# Patient Record
Sex: Female | Born: 1965 | Race: Black or African American | Hispanic: No | Marital: Married | Smoking: Never smoker
Health system: Southern US, Community
[De-identification: ages and names within clinical notes are randomized; demographics above are authoritative.]

## PROBLEM LIST (undated history)

## (undated) DIAGNOSIS — E079 Disorder of thyroid, unspecified: Secondary | ICD-10-CM

## (undated) DIAGNOSIS — I10 Essential (primary) hypertension: Secondary | ICD-10-CM

## (undated) DIAGNOSIS — E119 Type 2 diabetes mellitus without complications: Secondary | ICD-10-CM

## (undated) HISTORY — DX: Essential (primary) hypertension: I10

## (undated) HISTORY — DX: Disorder of thyroid, unspecified: E07.9

## (undated) HISTORY — PX: OTHER SURGICAL HISTORY: SHX169

## (undated) HISTORY — PX: ABDOMINAL HYSTERECTOMY: SHX81

## (undated) HISTORY — DX: Type 2 diabetes mellitus without complications: E11.9

---

## 2004-09-03 ENCOUNTER — Other Ambulatory Visit: Payer: Self-pay

## 2004-09-11 ENCOUNTER — Inpatient Hospital Stay: Payer: Self-pay

## 2004-09-24 ENCOUNTER — Ambulatory Visit: Payer: Self-pay

## 2004-09-26 ENCOUNTER — Ambulatory Visit: Payer: Self-pay

## 2006-07-28 ENCOUNTER — Emergency Department: Payer: Self-pay | Admitting: Unknown Physician Specialty

## 2007-03-24 ENCOUNTER — Emergency Department: Payer: Self-pay | Admitting: Emergency Medicine

## 2007-06-03 ENCOUNTER — Observation Stay: Payer: Self-pay | Admitting: Internal Medicine

## 2007-06-03 ENCOUNTER — Other Ambulatory Visit: Payer: Self-pay

## 2007-10-13 ENCOUNTER — Ambulatory Visit: Payer: Self-pay | Admitting: Family Medicine

## 2011-03-24 ENCOUNTER — Emergency Department: Payer: Self-pay | Admitting: Emergency Medicine

## 2011-08-12 ENCOUNTER — Emergency Department: Payer: Self-pay | Admitting: *Deleted

## 2011-08-12 LAB — COMPREHENSIVE METABOLIC PANEL
Albumin: 4.1 g/dL (ref 3.4–5.0)
Anion Gap: 10 (ref 7–16)
BUN: 12 mg/dL (ref 7–18)
Bilirubin,Total: 0.4 mg/dL (ref 0.2–1.0)
Calcium, Total: 9.9 mg/dL (ref 8.5–10.1)
Creatinine: 0.74 mg/dL (ref 0.60–1.30)
Glucose: 109 mg/dL — ABNORMAL HIGH (ref 65–99)
Osmolality: 282 (ref 275–301)
Potassium: 3.8 mmol/L (ref 3.5–5.1)
SGOT(AST): 17 U/L (ref 15–37)
Sodium: 141 mmol/L (ref 136–145)
Total Protein: 8.2 g/dL (ref 6.4–8.2)

## 2011-08-12 LAB — CBC
HCT: 36.7 % (ref 35.0–47.0)
HGB: 12.1 g/dL (ref 12.0–16.0)
MCHC: 32.9 g/dL (ref 32.0–36.0)
MCV: 85 fL (ref 80–100)
Platelet: 362 10*3/uL (ref 150–440)
RDW: 13.3 % (ref 11.5–14.5)
WBC: 7.7 10*3/uL (ref 3.6–11.0)

## 2012-04-27 ENCOUNTER — Ambulatory Visit: Payer: Self-pay | Admitting: Family Medicine

## 2012-08-08 ENCOUNTER — Observation Stay: Payer: Self-pay | Admitting: Internal Medicine

## 2012-08-08 LAB — BASIC METABOLIC PANEL
Anion Gap: 7 (ref 7–16)
BUN: 18 mg/dL (ref 7–18)
Chloride: 104 mmol/L (ref 98–107)
Co2: 27 mmol/L (ref 21–32)
EGFR (African American): 60 — ABNORMAL LOW
EGFR (Non-African Amer.): 52 — ABNORMAL LOW
Glucose: 177 mg/dL — ABNORMAL HIGH (ref 65–99)
Osmolality: 282 (ref 275–301)
Potassium: 4 mmol/L (ref 3.5–5.1)
Sodium: 138 mmol/L (ref 136–145)

## 2012-08-08 LAB — MAGNESIUM: Magnesium: 2 mg/dL

## 2012-08-08 LAB — CBC
MCH: 26.8 pg (ref 26.0–34.0)
MCV: 83 fL (ref 80–100)
RBC: 4.47 10*6/uL (ref 3.80–5.20)
RDW: 13.4 % (ref 11.5–14.5)
WBC: 5.3 10*3/uL (ref 3.6–11.0)

## 2012-08-08 LAB — HEPATIC FUNCTION PANEL A (ARMC)
Bilirubin, Direct: <0.1 mg/dL (ref 0.00–0.20)
Bilirubin,Total: 0.2 mg/dL (ref 0.2–1.0)
SGPT (ALT): 17 U/L (ref 12–78)

## 2012-08-08 LAB — LIPASE, BLOOD: Lipase: 125 U/L (ref 73–393)

## 2012-08-08 LAB — CK TOTAL AND CKMB (NOT AT ARMC)
CK, Total: 160 U/L (ref 21–215)
CK-MB: 1.1 ng/mL (ref 0.5–3.6)

## 2012-08-08 LAB — TROPONIN I: Troponin-I: 0.07 ng/mL — ABNORMAL HIGH

## 2012-08-09 DIAGNOSIS — I219 Acute myocardial infarction, unspecified: Secondary | ICD-10-CM

## 2012-08-09 LAB — CBC WITH DIFFERENTIAL/PLATELET
Basophil #: 0.1 10*3/uL (ref 0.0–0.1)
Basophil %: 1 %
Eosinophil %: 3.5 %
HGB: 10.9 g/dL — ABNORMAL LOW (ref 12.0–16.0)
Lymphocyte %: 34.5 %
MCH: 27.9 pg (ref 26.0–34.0)
Monocyte %: 8.1 %
Neutrophil #: 2.8 10*3/uL (ref 1.4–6.5)
RBC: 3.91 10*6/uL (ref 3.80–5.20)
RDW: 13.2 % (ref 11.5–14.5)
WBC: 5.2 10*3/uL (ref 3.6–11.0)

## 2012-08-09 LAB — BASIC METABOLIC PANEL
Anion Gap: 2 — ABNORMAL LOW (ref 7–16)
Calcium, Total: 9.6 mg/dL (ref 8.5–10.1)
EGFR (African American): >60
EGFR (Non-African Amer.): >60
Potassium: 3.8 mmol/L (ref 3.5–5.1)
Sodium: 134 mmol/L — ABNORMAL LOW (ref 136–145)

## 2012-08-09 LAB — LIPID PANEL
Cholesterol: 187 mg/dL (ref 0–200)
Ldl Cholesterol, Calc: 133 mg/dL — ABNORMAL HIGH (ref 0–100)
VLDL Cholesterol, Calc: 22 mg/dL (ref 5–40)

## 2012-08-09 LAB — MAGNESIUM: Magnesium: 2 mg/dL

## 2012-08-09 LAB — TROPONIN I: Troponin-I: 0.06 ng/mL — ABNORMAL HIGH

## 2012-08-09 LAB — HEMOGLOBIN A1C: Hemoglobin A1C: 7.8 % — ABNORMAL HIGH (ref 4.2–6.3)

## 2012-08-09 LAB — CK TOTAL AND CKMB (NOT AT ARMC): CK-MB: 0.7 ng/mL (ref 0.5–3.6)

## 2014-01-31 ENCOUNTER — Emergency Department: Payer: Self-pay | Admitting: Emergency Medicine

## 2014-01-31 LAB — COMPREHENSIVE METABOLIC PANEL
ALK PHOS: 55 U/L
ANION GAP: 8 (ref 7–16)
AST: 25 U/L (ref 15–37)
Albumin: 3.8 g/dL (ref 3.4–5.0)
BUN: 14 mg/dL (ref 7–18)
Bilirubin,Total: 0.2 mg/dL (ref 0.2–1.0)
CHLORIDE: 107 mmol/L (ref 98–107)
Calcium, Total: 10 mg/dL (ref 8.5–10.1)
Co2: 25 mmol/L (ref 21–32)
Creatinine: 0.85 mg/dL (ref 0.60–1.30)
EGFR (Non-African Amer.): >60
GLUCOSE: 115 mg/dL — AB (ref 65–99)
Osmolality: 281 (ref 275–301)
Potassium: 3.6 mmol/L (ref 3.5–5.1)
SGPT (ALT): 19 U/L (ref 12–78)
SODIUM: 140 mmol/L (ref 136–145)
Total Protein: 8 g/dL (ref 6.4–8.2)

## 2014-01-31 LAB — CBC WITH DIFFERENTIAL/PLATELET
BASOS ABS: 0 10*3/uL (ref 0.0–0.1)
Basophil %: 0.9 %
Eosinophil #: 0 10*3/uL (ref 0.0–0.7)
Eosinophil %: 0.9 %
HCT: 36.3 % (ref 35.0–47.0)
HGB: 12.2 g/dL (ref 12.0–16.0)
Lymphocyte #: 0.8 10*3/uL — ABNORMAL LOW (ref 1.0–3.6)
Lymphocyte %: 15.8 %
MCH: 28.2 pg (ref 26.0–34.0)
MCHC: 33.5 g/dL (ref 32.0–36.0)
MCV: 84 fL (ref 80–100)
Monocyte #: 0.3 x10 3/mm (ref 0.2–0.9)
Monocyte %: 5 %
Neutrophil #: 3.9 10*3/uL (ref 1.4–6.5)
Neutrophil %: 77.4 %
Platelet: 372 10*3/uL (ref 150–440)
RBC: 4.32 10*6/uL (ref 3.80–5.20)
RDW: 12.8 % (ref 11.5–14.5)
WBC: 5.1 10*3/uL (ref 3.6–11.0)

## 2014-01-31 LAB — URINALYSIS, COMPLETE
BACTERIA: NONE SEEN
BLOOD: NEGATIVE
Bilirubin,UR: NEGATIVE
GLUCOSE, UR: NEGATIVE mg/dL (ref 0–75)
Ketone: NEGATIVE
Leukocyte Esterase: NEGATIVE
NITRITE: NEGATIVE
Ph: 6 (ref 4.5–8.0)
Protein: NEGATIVE
Specific Gravity: 1.014 (ref 1.003–1.030)
Squamous Epithelial: NONE SEEN

## 2014-01-31 LAB — LIPASE, BLOOD: Lipase: 97 U/L (ref 73–393)

## 2014-04-15 ENCOUNTER — Emergency Department: Payer: Self-pay | Admitting: Emergency Medicine

## 2014-04-17 LAB — BETA STREP CULTURE(ARMC)

## 2014-09-12 ENCOUNTER — Ambulatory Visit: Payer: Self-pay | Admitting: Family Medicine

## 2014-11-09 NOTE — H&P (Signed)
PATIENT NAME:  Dorothy Hardin, Dorothy Hardin MR#:  [id number] DATE OF BIRTH:  06-05-1966  DATE OF ADMISSION:  08/08/2012  REFERRING PHYSICIAN: Malachy Moanevainder Goli, MD.  PRIMARY CARE PHYSICIAN: Gardiner Rhymeebecca Orendorff, MD.   CHIEF COMPLAINT: Headache.   HISTORY OF PRESENT ILLNESS: The patient is a pleasant, obese [place] female with history of hypertension, migraines, vertigo, who presents with headache. The patient said that the headache started originally last night. She took Tylenol. She went back to sleep last night and woke up this morning with the persistent headache and blood pressure has been a problem all day.  Initially was 203/101 and persistently close to 200s. Usually the blood pressure is 140s to 150s. She is on several blood pressure medications, but denies missing any medications. The patient, of note, had some cold symptoms and has been taking Alka-Seltzer plus cold, which has phenylephrine in it. Last taken around 3 to 4 days ago. She took it for 3 to 4 days. The patient denies having any chest pain, shortness of breath, but did have vomiting a couple of times. Here she was noted to have significant elevation of the blood pressure up to 217/131. She has had 20 mg of IV labetalol and then another 10 mg and pressures are in 170s. Hospitalist service was contacted for further evaluation and management.   PAST MEDICAL HISTORY: Hypertension, migraines, history of vertigo, menorrhagia and leiomyoma.   PAST SURGICAL HISTORY: Bilateral tubal ligation, status post total abdominal hysterectomy and bilateral salpingo-oophorectomy.   ALLERGIES: None.   SOCIAL HISTORY: No tobacco, alcohol or drug use, lives with her husband, works in Film/video editorembroidery.   FAMILY HISTORY: Mom with diabetes, asthma, and hypertension with gout, sister with gout.   OUTPATIENT MEDICATIONS:  Amlodipine/benazepril  5/20 mg 1 tab 2 times a day, hydrochlorothiazide 25 mg daily, Lisinopril 20 mg daily.   REVIEW OF  SYSTEMS: CONSTITUTIONAL: No fever or fatigue. No weakness. No weight changes.  EYES: No blurry vision or double vision.  ENT: No tinnitus. History of vertigo.  Positive for snoring. No sore throat.  RESPIRATORY: No cough, shortness of breath, wheezing, hemoptysis.  CARDIOVASCULAR: No chest pain, orthopnea, edema, or arrhythmia.  She has history of high blood pressure.  GASTROINTESTINAL: Positive for vomiting x 2 or 3 today, no abdominal pain, hematemesis, melena or ulcers. She had no dysuria, hematuria, or incontinence. HEMATOLOGIC AND LYMPHATIC: No anemia or easy bruising.  SKIN: No new rashes.  MUSCULOSKELETAL: Denies arthritis.  NEUROLOGIC: No numbness, weakness, dysarthria, dementia, or transient ischemic attack.  PSYCHIATRIC: No anxiety or insomnia.   PHYSICAL EXAMINATION: VITAL SIGNS: Temperature on arrival 99.1, pulse 89, respiratory rate 18, blood pressure 188/115, O2 sat 99% on room air.  GENERAL: The patient is an obese African American female lying in bed in no obvious distress, talking in full sentences.   HEENT: Normocephalic, atraumatic. Pupils are equal and reactive. Anicteric sclerae. Extraocular muscles intact.  NECK: Supple. No thyroid tenderness. Moist mucous membranes. No cervical lymphadenopathy.  CARDIOVASCULAR: S1, S2, regular rate and rhythm. No murmurs, rubs, or gallops.  LUNGS: Clear to auscultation without wheezing or rhonchi.  ABDOMEN: Soft, nontender, nondistended. Positive bowel sounds in all quadrants.  EXTREMITIES: No significant lower extremity edema.  SKIN: No obvious rashes.   NEUROLOGICAL: Cranial nerves II through XII grossly intact. Strength is 5/5 in all extremities.   PSYCHIATRIC: Awake, alert, oriented x 3. Cooperative, pleasant, conversant.   LABORATORY, DIAGNOSTIC, AND RADIOLOGICAL DATA: Glucose 177, creatinine 1.25, potassium 4, sodium 138, GFR 60, LFTs within normal limits.  Troponin 0.07, CK-MB 1.1. CBC within normal limits.   EKG: There is  normal sinus rhythm. There is flattening of T waves in 1 and AVL, as well as 2 and 3. There are some Q waves in 3. No acute ST elevations or depressions.   X-ray of the chest has been done, awaiting upload.   ASSESSMENT AND PLAN: We have a pleasant 49 year old African American female with obesity, hypertension, migraines, with headache in the setting of malignant hypertension. Admit the patient to the hospital. The patient has not missed any medications per her; however, has been taking Alka-Seltzer with Cold Plus, which has phenylephrine in it that can increase the blood pressure. She has received IV labetalol with improvement. We would continue IV labetalol p.r.n. as well as adding metoprolol p.o. and continue outpatient medications. We would not resume both the benazepril and lisinopril at this point. We would monitor blood pressure in telemetry. The patient does have positive troponins but has no significant chest pains, but there are some nonspecific T wave changes and some T wave flattening. We would cycle troponins, check lipids and hemoglobin A1c. Start on aspirin as well as a beta blocker and order an echocardiogram. We would order a stress test if the patient has stable troponins and no significant chest pains. The patient does have elevated blood glucose and we would check a hemoglobin A1c at this point. We started the patient on some Tylenol p.r.n. for the headaches, Zofran for symptomatic nausea if she has any further and Lovenox for deep vein thrombosis prophylaxis.   TOTAL TIME SPENT: 55 minutes.   CODE STATUS: Full code.    ____________________________ Krystal Eaton, MD sa:cc D: 08/08/2012 21:27:54 ET T: 08/08/2012 22:26:34 ET JOB#: [id number]  cc: Krystal Eaton, MD, <Dictator> Heidi Dach. Darliss Cheney, MD  Krystal Eaton MD ELECTRONICALLY SIGNED 08/25/2012 20:05

## 2014-11-09 NOTE — Discharge Summary (Signed)
PATIENT NAME:  Hardin, Dorothy DATE OF BIRTH:  04/02/1966  PRESENTING COMPLAINT: Headache and elevated blood pressure.   DISCHARGE DIAGNOSES: 1.  Malignant hypertension.  2.  Headache.  3.  Myoview stress test remained negative for ischemia.   MEDICATIONS: 1.  Hydrochlorothiazide 25 mg daily.  2.  Amlodipine 10 mg daily.  3.  Metoprolol 50 mg b.i.d.  4.  Lisinopril 20 mg 2 tablets once a day.   DIET: Regular, low sodium.   FOLLOW-UP:  Follow up with Dr. Gardiner Rhymeebecca Orendorff on 08/15/2012 at 2:20 p.m.   LABORATORY, DIAGNOSTIC AND RADIOLOGICAL DATA: Labs at discharge: Cardiac enzyme 0.06, 0.07.   Myoview stress test was no significant ischemia noted. Depressed ejection fraction of 35%. This is likely inaccurate, as she had an echocardiogram on admission with EF of 50 to 55%. No significant wall motion abnormalities were noted. Echo showed EF of 50 to 55%, essentially a normal study. H and H  is 10.9 and 32.4, white count is 5.2. Basic metabolic panel within normal limits. Magnesium 2.0. Hemoglobin A1c is 7.8. Lipid profile within normal limits, except LDL of 133.   CT of the head shows no evidence of acute intracranial abnormalities.   BRIEF SUMMARY OF HOSPITAL COURSE: The patient is a pleasant 49 year old [place] female with history of hypertension, comes in with:  1.  Malignant hypertension. Her blood pressure systolic was more than 215. She came in with nausea, vomiting and headache. Her blood pressure was stabilized after she received some IV  labetalol p.r.n. Her lisinopril, hydrochlorothiazide were continued. Metoprolol dose was increased and she was also on amlodipine. The patient remained stable otherwise. Positive troponin without chest pain, possibly due to demand ischemia for elevated hypertension. Stress test did not show any wall motion abnormality.  2.  Headache possibly from migraine. A trial of Fioricet was given. The patient did well. Neurologically, she remained  intact. Nausea, vomiting likely was from the migraine headache. Zofran helped with the symptoms. Hospital stay otherwise remained stable.   CODE STATUS: The patient remained a full code.   TIME SPENT: 40 minutes.    ____________________________ Wylie HailSona A. Allena KatzPatel, MD sap:cc D: 08/11/2012 14:29:00 ET T: 08/11/2012 16:02:58 ET JOB#: [id number]  cc: Heidi Dachebecca O. Darliss Cheneyrendorff, MD Billyjoe Go A. Allena KatzPatel, MD, <Dictator>  Willow OraSONA A Emiliana Blaize MD ELECTRONICALLY SIGNED 08/12/2012 21:34

## 2016-06-23 IMAGING — CT CT ABD-PELV W/ CM
2 of 5 series · 16 of 46 positions shown, 18 images · IV contrast (isovue)
Comparison: 08/12/2011

CLINICAL DATA: Abdominal and pelvic pain since this morning

EXAM:
CT ABDOMEN AND PELVIS WITH CONTRAST
TECHNIQUE: Multidetector CT imaging of the abdomen and pelvis was performed
using the standard protocol following bolus administration of
intravenous contrast.
CONTRAST:  100 mL Isovue 370

[Series 2: routine abd pel with · axial · 0.77mm/px · z∈[-908,-468]mm · 13 of 98 slices shown, 15 images]
[im 5/98  soft-tissue]
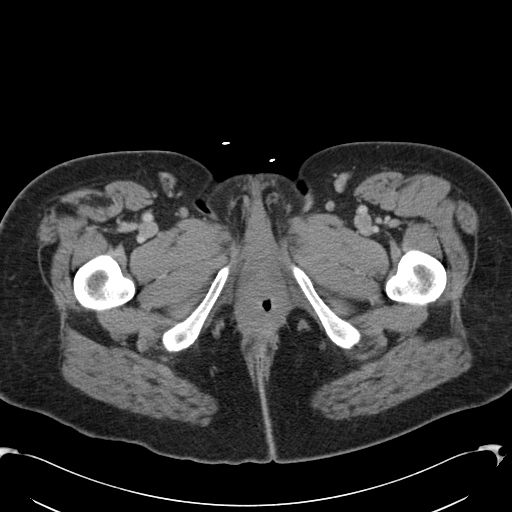
[im 5/98  bone]
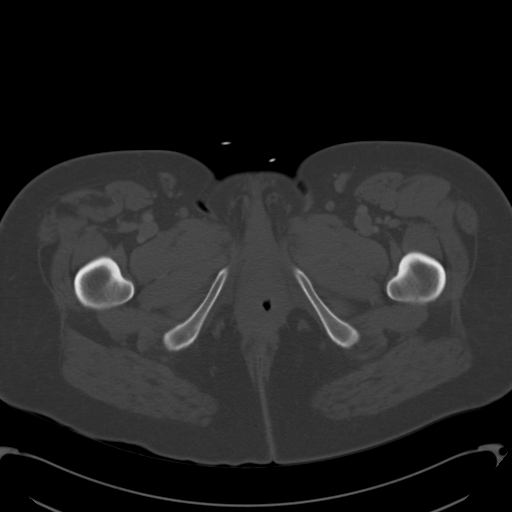
[im 15/98  soft-tissue]
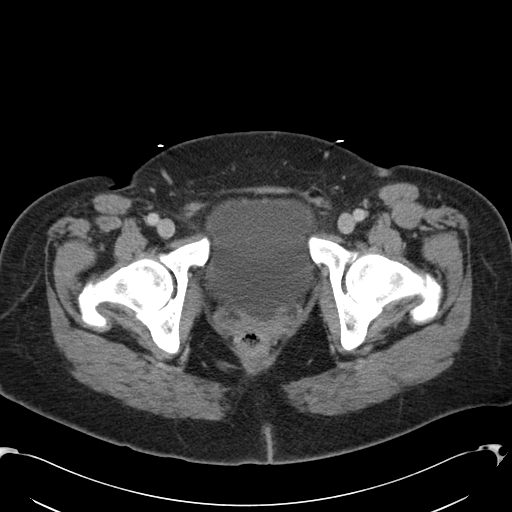
[im 20/98  soft-tissue]
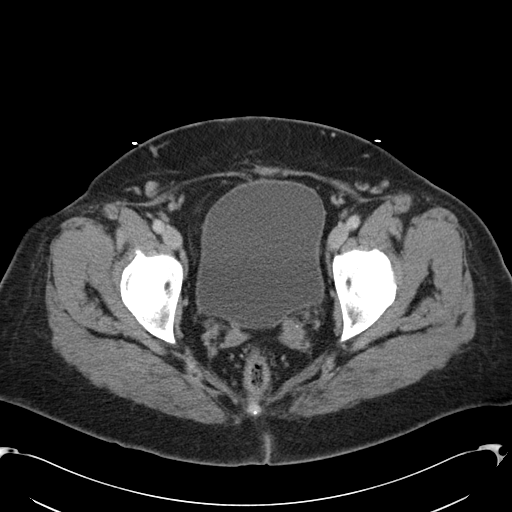
[im 30/98  soft-tissue]
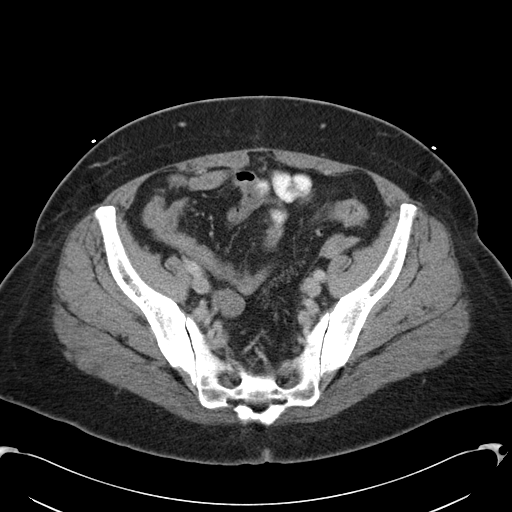
[im 34/98  soft-tissue]
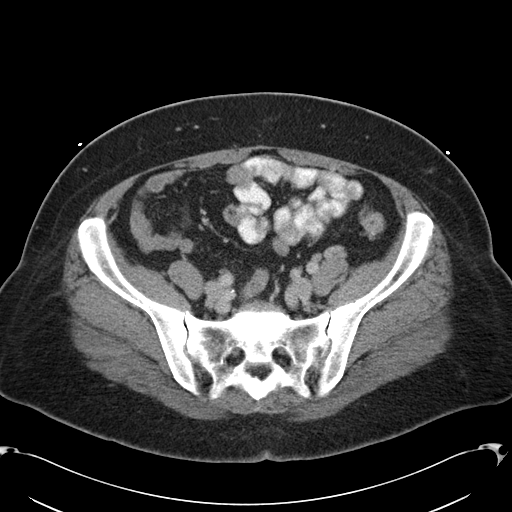
[im 44/98  soft-tissue]
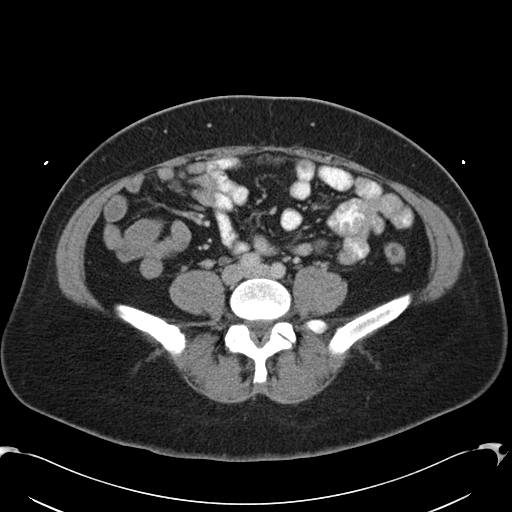
[im 49/98  soft-tissue]
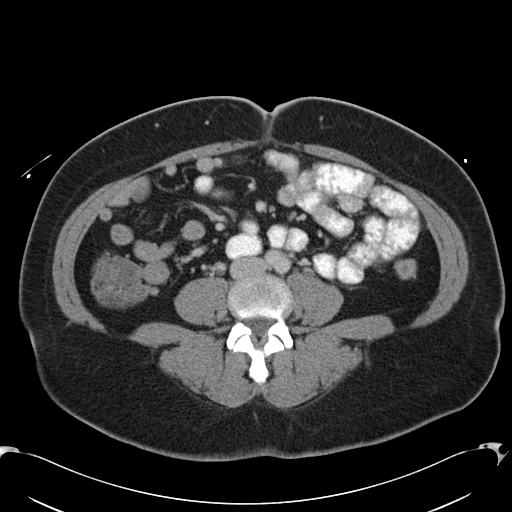
[im 54/98  soft-tissue]
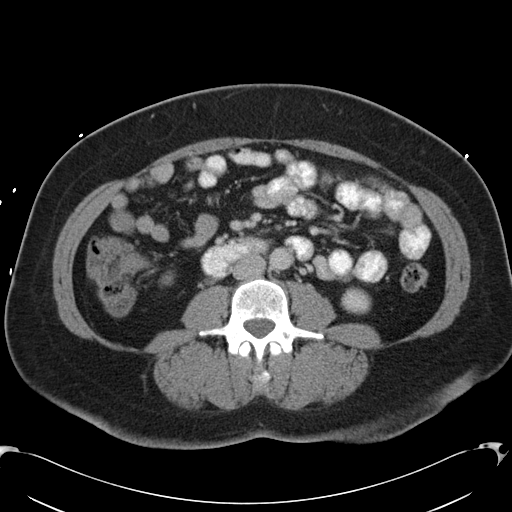
[im 64/98  soft-tissue]
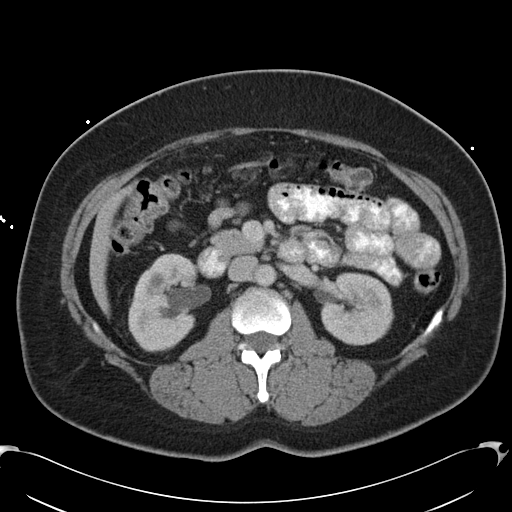
[im 64/98  bone]
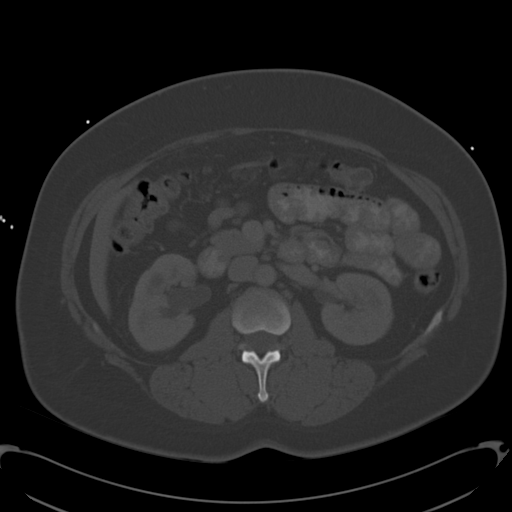
[im 68/98  soft-tissue]
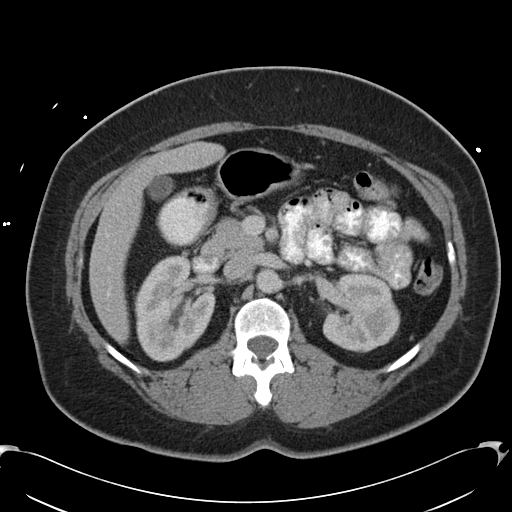
[im 78/98  soft-tissue]
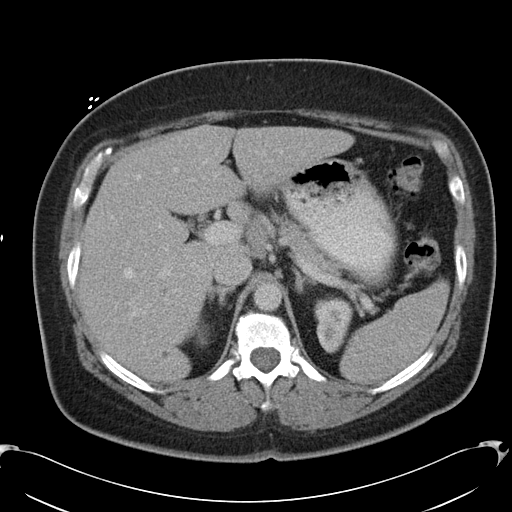
[im 83/98  soft-tissue]
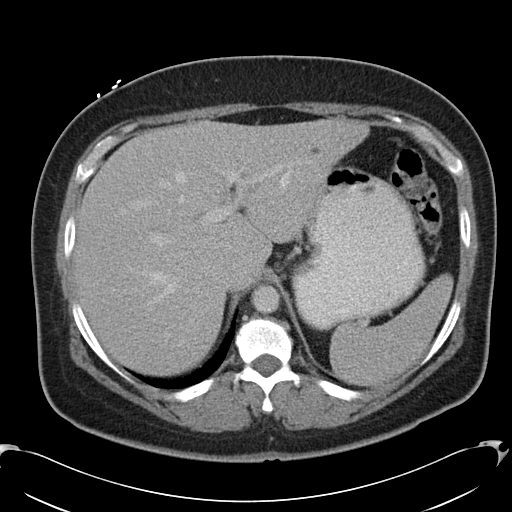
[im 93/98  soft-tissue]
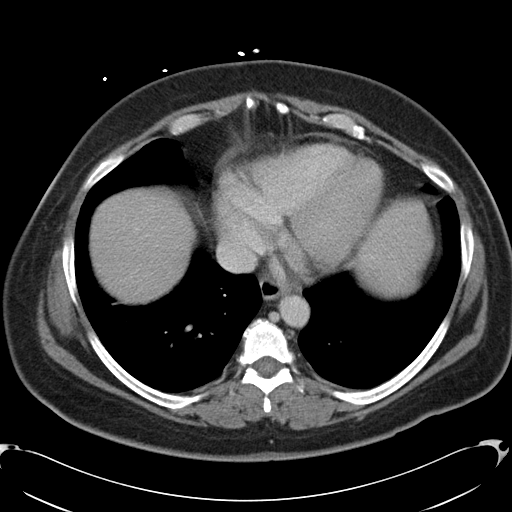

[Series 5: cor routine abd pel with · coronal · 0.72mm/px · 3 of 147 slices shown]
[im 49/147  soft-tissue]
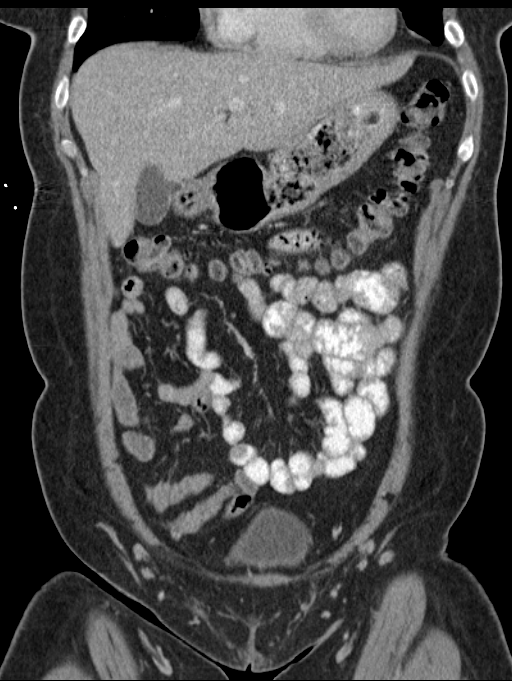
[im 65/147  soft-tissue]
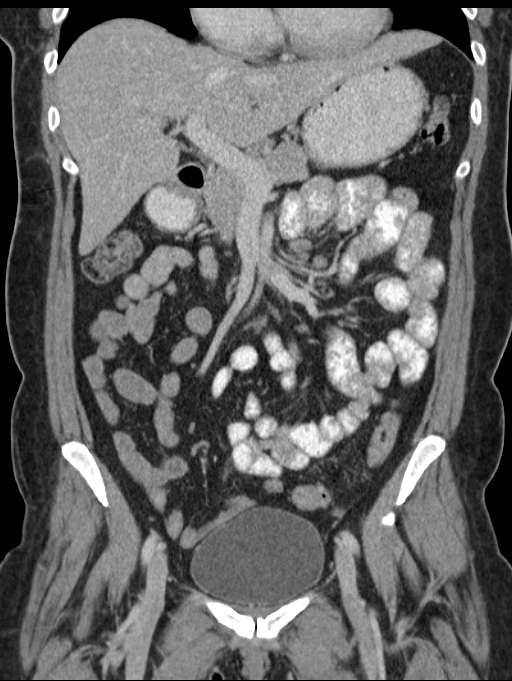
[im 82/147  soft-tissue]
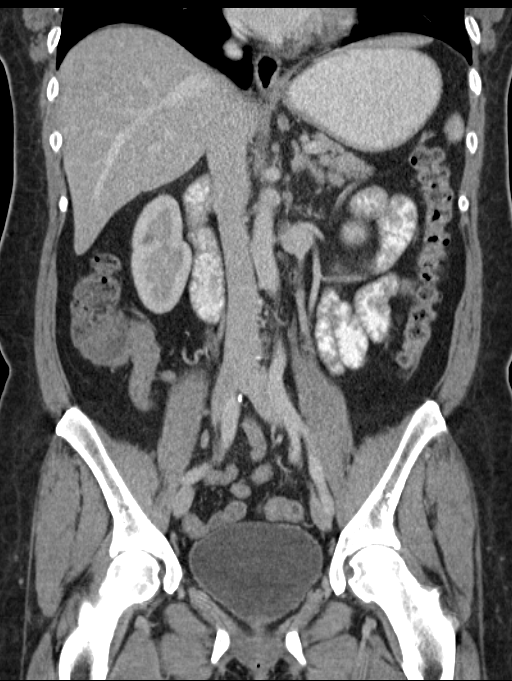

[16 of 46 positions shown; findings below may reference images not displayed]

FINDINGS: Visualized lung bases clear.  No acute musculoskeletal findings.

Multiple tiny sub cm low-attenuation liver lesions. These are likely
cysts but are too small to characterize. Gallbladder, spleen,
pancreas, and adrenal glands are normal. Left kidney is normal.
There is a 1.5 cm upper pole right renal cyst which is stable. There
is a 7 mm low-attenuation lesion midpole right kidney too small to
characterize but likely also a cyst.

Nonobstructive bowel gas pattern. Appendix is normal. Moderate
diverticulosis sigmoid colon without evidence of active
inflammation.

Reproductive organs absent or not identified. Bladder negative. No
free fluid. Aorta not dilated.

Periaortic retroperitoneal lymph nodes stable measuring up to 7 mm.
Peripancreatic lymph nodes stable measuring up to 11 mm.
IMPRESSION: No acute findings. Stable diverticulosis. A few enlarged abdominal
lymph nodes appears stable. These do not appear to be of acute
significance.

## 2022-07-10 ENCOUNTER — Other Ambulatory Visit: Payer: Self-pay

## 2022-07-10 ENCOUNTER — Emergency Department: Payer: No Typology Code available for payment source

## 2022-07-10 ENCOUNTER — Emergency Department
Admission: EM | Admit: 2022-07-10 | Discharge: 2022-07-10 | Disposition: A | Discharge status: Home / Self Care | Payer: No Typology Code available for payment source | Attending: Emergency Medicine | Admitting: Emergency Medicine

## 2022-07-10 DIAGNOSIS — Y92410 Unspecified street and highway as the place of occurrence of the external cause: Secondary | ICD-10-CM | POA: Diagnosis not present

## 2022-07-10 DIAGNOSIS — R519 Headache, unspecified: Secondary | ICD-10-CM | POA: Insufficient documentation

## 2022-07-10 DIAGNOSIS — S161XXA Strain of muscle, fascia and tendon at neck level, initial encounter: Secondary | ICD-10-CM | POA: Insufficient documentation

## 2022-07-10 DIAGNOSIS — S199XXA Unspecified injury of neck, initial encounter: Secondary | ICD-10-CM | POA: Diagnosis present

## 2022-07-10 MED ORDER — ONDANSETRON 4 MG PO TBDP
4.0000 mg | ORAL_TABLET | Freq: Once | ORAL | Status: AC
  Administered 2022-07-10: 4 mg via ORAL
  Filled 2022-07-10: qty 1

## 2022-07-10 MED ORDER — METHOCARBAMOL 500 MG PO TABS
1000.0000 mg | ORAL_TABLET | Freq: Once | ORAL | Status: AC
  Administered 2022-07-10: 1000 mg via ORAL
  Filled 2022-07-10: qty 2

## 2022-07-10 MED ORDER — MELOXICAM 15 MG PO TABS: 15.0000 mg | ORAL_TABLET | Freq: Every day | ORAL | 0 refills | Status: AC

## 2022-07-10 MED ORDER — METHOCARBAMOL 500 MG PO TABS: 500.0000 mg | ORAL_TABLET | Freq: Four times a day (QID) | ORAL | 0 refills | Status: AC

## 2022-07-10 MED ORDER — ACETAMINOPHEN 325 MG PO TABS
650.0000 mg | ORAL_TABLET | Freq: Once | ORAL | Status: AC
  Administered 2022-07-10: 650 mg via ORAL
  Filled 2022-07-10: qty 2

## 2022-07-10 MED ORDER — KETOROLAC TROMETHAMINE 30 MG/ML IJ SOLN
30.0000 mg | Freq: Once | INTRAMUSCULAR | Status: AC
  Administered 2022-07-10: 30 mg via INTRAMUSCULAR
  Filled 2022-07-10: qty 1
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
## 2024-03-21 ENCOUNTER — Other Ambulatory Visit: Payer: Self-pay

## 2024-03-21 DIAGNOSIS — M5135 Other intervertebral disc degeneration, thoracolumbar region: Secondary | ICD-10-CM

## 2024-03-31 ENCOUNTER — Ambulatory Visit
Admission: RE | Admit: 2024-03-31 | Discharge: 2024-03-31 | Disposition: A | Discharge status: Home / Self Care | Source: Ambulatory Visit

## 2024-03-31 ENCOUNTER — Ambulatory Visit: Admission: RE | Admit: 2024-03-31 | Discharge: 2024-03-31 | Disposition: A | Discharge status: Home / Self Care

## 2024-03-31 DIAGNOSIS — M5135 Other intervertebral disc degeneration, thoracolumbar region: Secondary | ICD-10-CM
